# Patient Record
Sex: Female | Born: 1955 | Race: Black or African American | Hispanic: No | Marital: Married | Smoking: Never smoker
Health system: Southern US, Community
[De-identification: ages and names within clinical notes are randomized; demographics above are authoritative.]

## PROBLEM LIST (undated history)

## (undated) DIAGNOSIS — M1990 Unspecified osteoarthritis, unspecified site: Secondary | ICD-10-CM

## (undated) DIAGNOSIS — D649 Anemia, unspecified: Secondary | ICD-10-CM

## (undated) DIAGNOSIS — E785 Hyperlipidemia, unspecified: Secondary | ICD-10-CM

## (undated) DIAGNOSIS — I10 Essential (primary) hypertension: Secondary | ICD-10-CM

## (undated) HISTORY — PX: HEMORRHOID SURGERY: SHX153

## (undated) HISTORY — DX: Anemia, unspecified: D64.9

## (undated) HISTORY — DX: Hyperlipidemia, unspecified: E78.5

## (undated) HISTORY — DX: Essential (primary) hypertension: I10

## (undated) HISTORY — PX: CHEST SURGERY: SHX595

## (undated) HISTORY — PX: BACK SURGERY: SHX140

## (undated) HISTORY — DX: Unspecified osteoarthritis, unspecified site: M19.90

---

## 2005-05-13 ENCOUNTER — Other Ambulatory Visit: Payer: Self-pay

## 2005-05-13 ENCOUNTER — Ambulatory Visit: Payer: Self-pay | Admitting: General Surgery

## 2005-05-19 ENCOUNTER — Ambulatory Visit: Payer: Self-pay | Admitting: General Surgery

## 2005-11-12 ENCOUNTER — Ambulatory Visit: Payer: Self-pay | Admitting: Obstetrics and Gynecology

## 2005-11-20 ENCOUNTER — Ambulatory Visit: Payer: Self-pay | Admitting: Obstetrics and Gynecology

## 2006-01-14 ENCOUNTER — Ambulatory Visit: Payer: Self-pay | Admitting: Internal Medicine

## 2006-05-11 ENCOUNTER — Emergency Department: Payer: Self-pay | Admitting: Emergency Medicine

## 2011-06-15 ENCOUNTER — Ambulatory Visit: Payer: Self-pay | Admitting: Internal Medicine

## 2013-02-08 ENCOUNTER — Encounter: Payer: Self-pay | Admitting: *Deleted

## 2013-03-08 ENCOUNTER — Ambulatory Visit (INDEPENDENT_AMBULATORY_CARE_PROVIDER_SITE_OTHER): Payer: Medicare Other | Admitting: General Surgery

## 2013-03-08 ENCOUNTER — Encounter: Payer: Self-pay | Admitting: General Surgery

## 2013-03-08 VITALS — BP 130/74 | HR 76 | Resp 12 | Ht 62.0 in | Wt 206.0 lb

## 2013-03-08 DIAGNOSIS — Z1211 Encounter for screening for malignant neoplasm of colon: Secondary | ICD-10-CM

## 2013-03-08 MED ORDER — POLYETHYLENE GLYCOL 3350 17 GM/SCOOP PO POWD
ORAL | Status: DC
Start: 2013-03-08 — End: 2022-08-25

## 2013-03-08 NOTE — Progress Notes (Signed)
Patient ID: Rhonda PileCecelia Desjardin, female   DOB: October 30, 1955, 58 y.o.   MRN: [id number]  Chief Complaint  Patient presents with  . Colonoscopy    HPI Rhonda Mclean is a 58 y.o. female here today for an screening colonoscopy exam. Patient states she has never had one before. No GI problems. Blood work done  02/03/13. HPI  Past Medical History  Diagnosis Date  . Hypertension   . Anemia   . Hyperlipidemia     Past Surgical History  Procedure Laterality Date  . Chest surgery      cast  . Back surgery      cast  . Hemorrhoid surgery      History reviewed. No pertinent family history.  Social History History  Substance Use Topics  . Smoking status: Never Smoker   . Smokeless tobacco: Never Used  . Alcohol Use: No    Allergies  Allergen Reactions  . Penicillins     Current Outpatient Prescriptions  Medication Sig Dispense Refill  . amLODipine (NORVASC) 5 MG tablet Take 5 mg by mouth daily.      . ARIPiprazole (ABILIFY) 10 MG tablet Take 10 mg by mouth daily.      Marland Kitchen. lisinopril (PRINIVIL,ZESTRIL) 20 MG tablet Take 20 mg by mouth daily.      . meloxicam (MOBIC) 7.5 MG tablet Take 7.5 mg by mouth daily.      Marland Kitchen. OLANZapine (ZYPREXA) 15 MG tablet Take 15 mg by mouth at bedtime.      . polyethylene glycol powder (GLYCOLAX/MIRALAX) powder 255 grams one bottle for colonoscopy prep  255 g  0   No current facility-administered medications for this visit.    Review of Systems Review of Systems  Constitutional: Negative.   Respiratory: Negative.   Cardiovascular: Negative.     Blood pressure 130/74, pulse 76, resp. rate 12, height 5\' 2"  (1.575 m), weight 206 lb (93.441 kg).  Physical Exam Physical Exam  Constitutional: She is oriented to person, place, and time. She appears well-developed and well-nourished.  Eyes: Conjunctivae are normal.  Neck: Neck supple.  Cardiovascular: Normal rate, regular rhythm and normal heart sounds.   Pulmonary/Chest: Breath sounds normal.   Abdominal: Soft. Bowel sounds are normal. There is no hepatosplenomegaly. There is no tenderness. No hernia.  Neurological: She is alert and oriented to person, place, and time.  Skin: Skin is warm and dry.    Data Reviewed PCP notes and labs  Assessment    Screening for malignant neoplasm colon, average risk     Plan    Discussed colonoscopy, procedure, risks and benefits. Pt is agreeable.     Patient has been scheduled for [facility].    SANKAR,SEEPLAPUTHUR G 03/08/2013, 1:41 PM

## 2013-03-08 NOTE — Patient Instructions (Signed)
Patient has been scheduled for a colonoscopy on 03-15-13 at [facility].

## 2013-03-15 ENCOUNTER — Ambulatory Visit: Payer: Self-pay | Admitting: General Surgery

## 2013-03-15 DIAGNOSIS — K5730 Diverticulosis of large intestine without perforation or abscess without bleeding: Secondary | ICD-10-CM

## 2013-03-15 DIAGNOSIS — Z1211 Encounter for screening for malignant neoplasm of colon: Secondary | ICD-10-CM

## 2013-03-23 ENCOUNTER — Encounter: Payer: Self-pay | Admitting: General Surgery

## 2013-12-04 ENCOUNTER — Encounter: Payer: Self-pay | Admitting: General Surgery

## 2018-04-19 ENCOUNTER — Ambulatory Visit: Payer: Medicare Other | Admitting: General Surgery

## 2018-04-22 ENCOUNTER — Encounter: Payer: Self-pay | Admitting: *Deleted

## 2018-07-21 ENCOUNTER — Encounter: Payer: Self-pay | Admitting: *Deleted

## 2020-07-04 ENCOUNTER — Emergency Department
Admission: EM | Admit: 2020-07-04 | Discharge: 2020-07-04 | Disposition: A | Discharge status: Home / Self Care | Payer: Medicare Other | Attending: Emergency Medicine | Admitting: Emergency Medicine

## 2020-07-04 ENCOUNTER — Emergency Department: Payer: Medicare Other

## 2020-07-04 ENCOUNTER — Other Ambulatory Visit: Payer: Self-pay

## 2020-07-04 ENCOUNTER — Encounter: Payer: Self-pay | Admitting: Emergency Medicine

## 2020-07-04 DIAGNOSIS — M25461 Effusion, right knee: Secondary | ICD-10-CM | POA: Diagnosis not present

## 2020-07-04 DIAGNOSIS — Z79899 Other long term (current) drug therapy: Secondary | ICD-10-CM | POA: Diagnosis not present

## 2020-07-04 DIAGNOSIS — M25569 Pain in unspecified knee: Secondary | ICD-10-CM

## 2020-07-04 DIAGNOSIS — M25561 Pain in right knee: Secondary | ICD-10-CM | POA: Diagnosis present

## 2020-07-04 DIAGNOSIS — I10 Essential (primary) hypertension: Secondary | ICD-10-CM | POA: Insufficient documentation

## 2020-07-04 MED ORDER — PREDNISONE 10 MG PO TABS: 10.0000 mg | ORAL_TABLET | Freq: Every day | ORAL | 0 refills | Status: DC

## 2020-07-04 MED ORDER — HYDROCODONE-ACETAMINOPHEN 5-325 MG PO TABS: 1.0000 | ORAL_TABLET | ORAL | 0 refills | Status: AC | PRN

## 2020-07-04 MED ORDER — PREDNISONE 20 MG PO TABS
60.0000 mg | ORAL_TABLET | Freq: Once | ORAL | Status: AC
  Administered 2020-07-04: 60 mg via ORAL
  Filled 2020-07-04: qty 3

## 2020-07-04 MED ORDER — HYDROCODONE-ACETAMINOPHEN 5-325 MG PO TABS
1.0000 | ORAL_TABLET | Freq: Once | ORAL | Status: AC
  Administered 2020-07-04: 1 via ORAL
  Filled 2020-07-04: qty 1

## 2020-07-04 NOTE — ED Triage Notes (Signed)
Pt to ED via [facility] with c/o R knee swelling, per EMS pt started having pain, around 8pm noted swelling, per EMS pt has been unable to get out of bed. Per EMS pt with no known injury at this time.

## 2020-07-04 NOTE — ED Triage Notes (Signed)
Pt comes into the ED via [facility] c/o right knee pain and swelling.  Pt states she has been unable to get out of her bed since the pain started.  Pt denies any known injuries at this time.

## 2020-07-04 NOTE — ED Provider Notes (Signed)
[facility] Emergency Department Provider Note  ____________________________________________  Time seen: Approximately 3:50 PM  I have reviewed the triage vital signs and the nursing notes.   HISTORY  Chief Complaint Knee Pain    HPI Rhonda Mclean is a 65 y.o. female who presents the emergency department via EMS for right knee pain.  Patient states that she started to have pain in her right knee last night.  No reported trauma.  Patient denies any hip pain, back pain.  Pain is located along the lateral joint line.  No other injury or complaint.  No medications prior to arrival.   No recent illnesses.  Patient denies any history of gout.        Past Medical History:  Diagnosis Date  . Anemia   . Hyperlipidemia   . Hypertension     There are no problems to display for this patient.   Past Surgical History:  Procedure Laterality Date  . BACK SURGERY     cast  . CHEST SURGERY     cast  . HEMORRHOID SURGERY      Prior to Admission medications   Medication Sig Start Date End Date Taking? Authorizing Provider  HYDROcodone-acetaminophen (NORCO/VICODIN) 5-325 MG tablet Take 1 tablet by mouth every 4 (four) hours as needed for moderate pain. 07/04/20 07/04/21 Yes Colbe Viviano, Delorise Royals, PA-C  predniSONE (DELTASONE) 10 MG tablet Take 1 tablet (10 mg total) by mouth daily. 07/04/20  Yes Ludene Stokke, Delorise Royals, PA-C  amLODipine (NORVASC) 5 MG tablet Take 5 mg by mouth daily.    [provider]  ARIPiprazole (ABILIFY) 10 MG tablet Take 10 mg by mouth daily.    [provider]  lisinopril (PRINIVIL,ZESTRIL) 20 MG tablet Take 20 mg by mouth daily.    [provider]  meloxicam (MOBIC) 7.5 MG tablet Take 7.5 mg by mouth daily.    [provider]  OLANZapine (ZYPREXA) 15 MG tablet Take 15 mg by mouth at bedtime.    [provider]  polyethylene glycol powder (GLYCOLAX/MIRALAX) powder 255 grams one bottle for colonoscopy  prep 03/08/13   Kieth Brightly, MD    Allergies Penicillins  History reviewed. No pertinent family history.  Social History Social History   Tobacco Use  . Smoking status: Never Smoker  . Smokeless tobacco: Never Used  Substance Use Topics  . Alcohol use: No  . Drug use: No     Review of Systems  Constitutional: No fever/chills Eyes: No visual changes. No discharge ENT: No upper respiratory complaints. Cardiovascular: no chest pain. Respiratory: no cough. No SOB. Gastrointestinal: No abdominal pain.  No nausea, no vomiting.  No diarrhea.  No constipation. Musculoskeletal: Negative for musculoskeletal pain. Skin: Negative for rash, abrasions, lacerations, ecchymosis. Neurological: Negative for headaches, focal weakness or numbness.  10 System ROS otherwise negative.  ____________________________________________   PHYSICAL EXAM:  VITAL SIGNS: ED Triage Vitals [07/04/20 1336]  Enc Vitals Group     BP (!) 162/98     Pulse Rate 74     Resp 17     Temp 98.5 F (36.9 C)     Temp Source Oral     SpO2 97 %     Weight 205 lb 14.6 oz (93.4 kg)     Height 5\' 2"  (1.575 m)     Head Circumference      Peak Flow      Pain Score 8     Pain Loc  Pain Edu?      Excl. in GC?      Constitutional: Alert and oriented. Well appearing and in no acute distress. Eyes: Conjunctivae are normal. PERRL. EOMI. Head: Atraumatic. ENT:      Ears:       Nose: No congestion/rhinnorhea.      Mouth/Throat: Mucous membranes are moist.  Neck: No stridor.    Cardiovascular: Normal rate, regular rhythm. Normal S1 and S2.  Good peripheral circulation. Respiratory: Normal respiratory effort without tachypnea or retractions. Lungs CTAB. Good air entry to the bases with no decreased or absent breath sounds. Musculoskeletal: Full range of motion to all extremities. No gross deformities appreciated.  Visualization of the right knee reveals minimal edema when compared with left.  There  is no overlying erythema.  Palpation reveals tenderness along the lateral joint line but no other tenderness.  Full passive range of motion though patient has limited active range of motion due to pain.  No ballottement.  No deficits along the quadriceps or patellar ligament/tendon.  Special tests are negative.  No warmth to palpation.  Dorsalis pedis pulses sensation intact distally. Neurologic:  Normal speech and language. No gross focal neurologic deficits are appreciated.  Skin:  Skin is warm, dry and intact. No rash noted. Psychiatric: Mood and affect are normal. Speech and behavior are normal. Patient exhibits appropriate insight and judgement.   ____________________________________________   LABS (all labs ordered are listed, but only abnormal results are displayed)  Labs Reviewed - No data to display ____________________________________________  EKG   ____________________________________________  RADIOLOGY I personally viewed and evaluated these images as part of my medical decision making, as well as reviewing the written report by the radiologist.  ED Provider Interpretation: Degenerative changes advanced from previous x-ray 15 years ago  DG Knee Complete 4 Views Right  Result Date: 07/04/2020 CLINICAL DATA:  RIGHT knee pain and swelling EXAM: RIGHT KNEE - COMPLETE 4+ VIEW COMPARISON:  01/14/2006 FINDINGS: Osseous demineralization. Mild joint space narrowing with scattered marginal spurs. Large joint effusion. No acute fracture, dislocation, or bone destruction. Mild chondrocalcinosis question CPPD. 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 Large RIGHT knee joint effusion. Electronically Signed   By: Ulyses Southward M.D.   On: 07/04/2020 14:16    ____________________________________________    PROCEDURES  Procedure(s) performed:    Procedures    Medications  predniSONE (DELTASONE) tablet 60 mg (60 mg Oral Given 07/04/20 1624)   HYDROcodone-acetaminophen (NORCO/VICODIN) 5-325 MG per tablet 1 tablet (1 tablet Oral Given 07/04/20 1624)     ____________________________________________   INITIAL IMPRESSION / ASSESSMENT AND PLAN / ED COURSE  Pertinent labs & imaging results that were available during my care of the patient were reviewed by me and considered in my medical decision making (see chart for details).  Review of the [place] CSRS was performed in accordance of the [facility] prior to dispensing any controlled drugs.           Patient's diagnosis is consistent with knee pain, knee effusion.  Patient presented to the emergency department complaining of knee pain that began yesterday.  This was reported as nontraumatic in nature.  Visual exam revealed mild amount of edema, no erythema.  There was no significant warmth to the joint.  Patient was tender along the lateral joint line.  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  Differential included arthritis, knee strain, gout, septic joint.  Given the physical exam I feel  that gout and septic joint is largely unlikely.  Given no reported trauma a occult fracture is also unlikely.  Suspect that symptoms are largely secondary to arthritic changes.  Patient will be placed on anti-inflammatories, given walker for ambulation.  Patient should follow-up with orthopedics as needed..  I discussed the findings and recommendations with the patient is given ED precautions to return to the ED for any worsening or new symptoms.     ____________________________________________  FINAL CLINICAL IMPRESSION(S) / ED DIAGNOSES  Final diagnoses:  Knee pain  Acute pain of right knee  Effusion of right knee      NEW MEDICATIONS STARTED DURING THIS VISIT:  ED Discharge Orders         Ordered    predniSONE (DELTASONE) 10 MG tablet  Daily       Note to Pharmacy: Take 6 pills x 2 days, 5 pills x 2 days, 4 pills x 2 days, 3 pills x 2 days, 2  pills x 2 days, and 1 pill x 2 days   07/04/20 1628    HYDROcodone-acetaminophen (NORCO/VICODIN) 5-325 MG tablet  Every 4 hours PRN        07/04/20 1628              This chart was dictated using voice recognition software/Dragon. Despite best efforts to proofread, errors can occur which can change the meaning. Any change was purely unintentional.    Racheal Patches, PA-C 07/04/20 1629    Delton Prairie, MD 07/04/20 [phone]

## 2021-09-18 ENCOUNTER — Other Ambulatory Visit: Payer: Self-pay | Admitting: Internal Medicine

## 2021-09-18 DIAGNOSIS — Z1231 Encounter for screening mammogram for malignant neoplasm of breast: Secondary | ICD-10-CM

## 2021-10-14 ENCOUNTER — Ambulatory Visit
Admission: RE | Admit: 2021-10-14 | Discharge: 2021-10-14 | Disposition: A | Discharge status: Home / Self Care | Payer: Medicare Other | Source: Ambulatory Visit | Attending: Internal Medicine | Admitting: Internal Medicine

## 2021-10-14 DIAGNOSIS — Z1231 Encounter for screening mammogram for malignant neoplasm of breast: Secondary | ICD-10-CM | POA: Insufficient documentation

## 2021-10-15 ENCOUNTER — Inpatient Hospital Stay
Admission: RE | Admit: 2021-10-15 | Discharge: 2021-10-15 | Disposition: A | Discharge status: Home / Self Care | Payer: Self-pay | Source: Ambulatory Visit | Attending: *Deleted | Admitting: *Deleted

## 2021-10-15 ENCOUNTER — Other Ambulatory Visit: Payer: Self-pay | Admitting: *Deleted

## 2021-10-15 DIAGNOSIS — Z1231 Encounter for screening mammogram for malignant neoplasm of breast: Secondary | ICD-10-CM

## 2021-10-24 IMAGING — CR DG KNEE COMPLETE 4+V*R*
4 series · 4 of 4 positions shown · non-contrast
Comparison: 01/14/2006

CLINICAL DATA: RIGHT knee pain and swelling

EXAM:
RIGHT KNEE - COMPLETE 4+ VIEW

[knee ap]
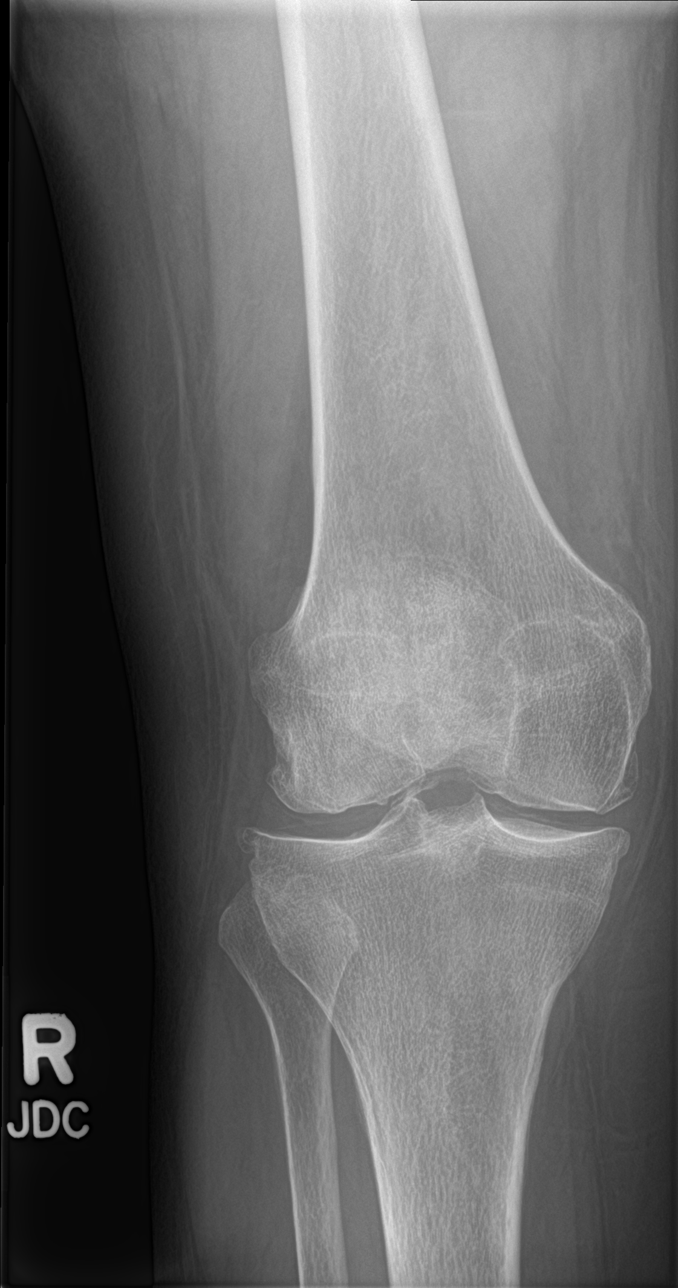

[knee obl (1 of 2)]
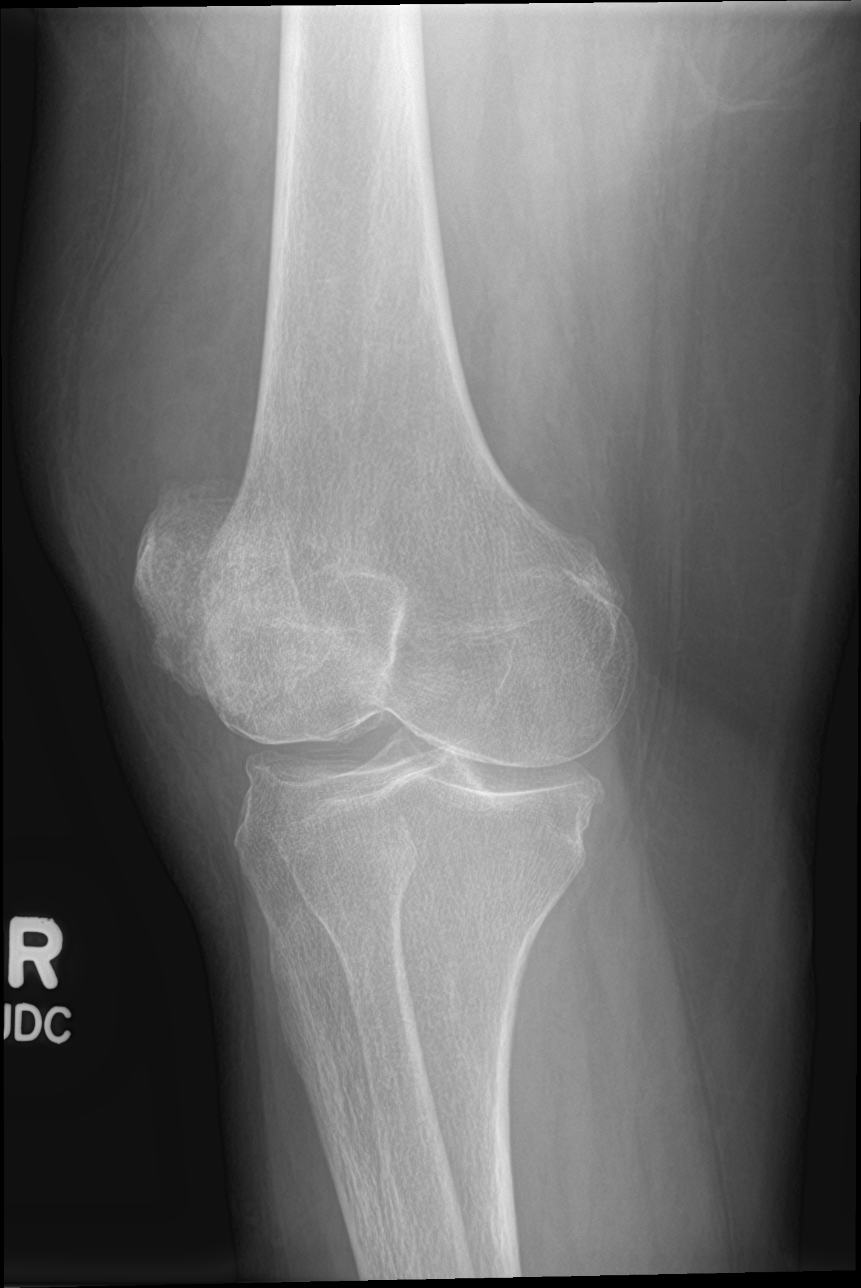

[knee obl (2 of 2)]
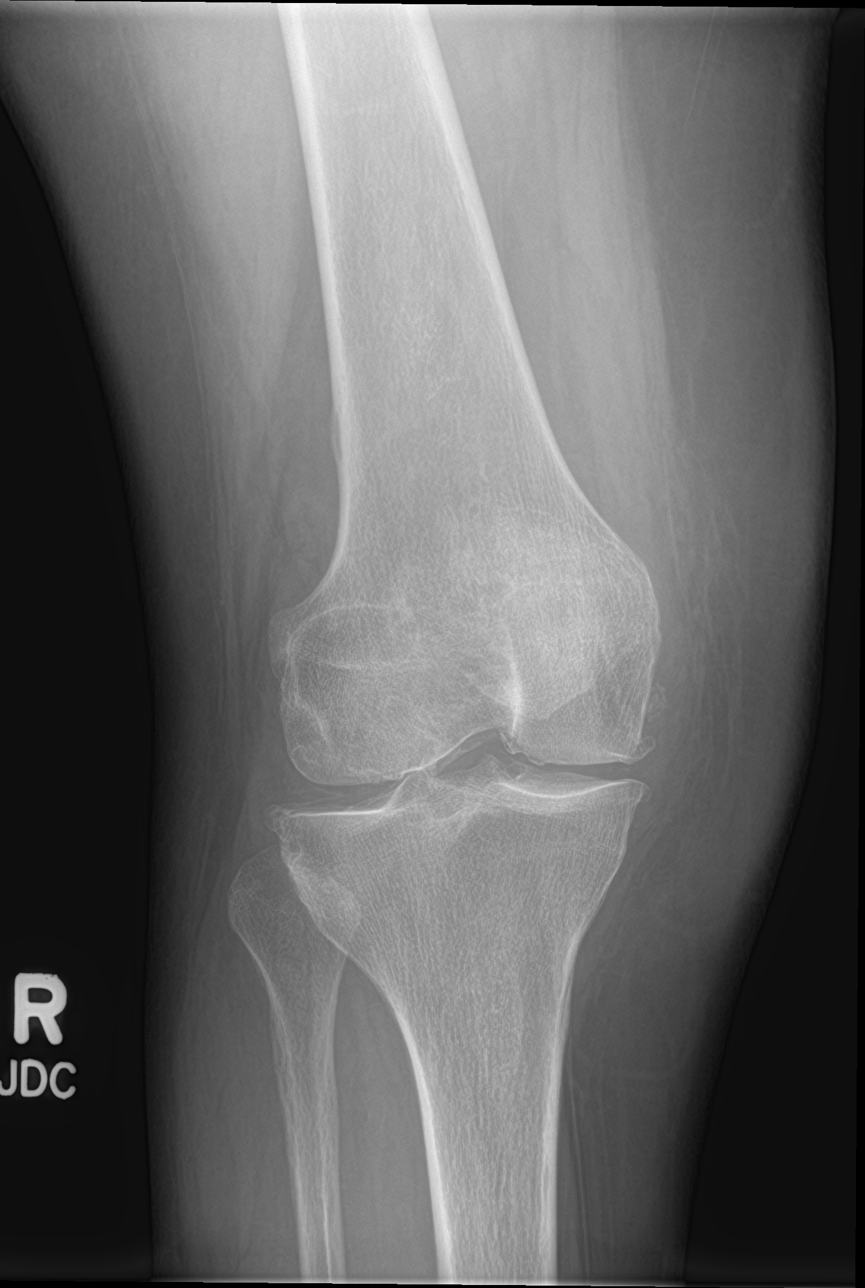

[knee lat]
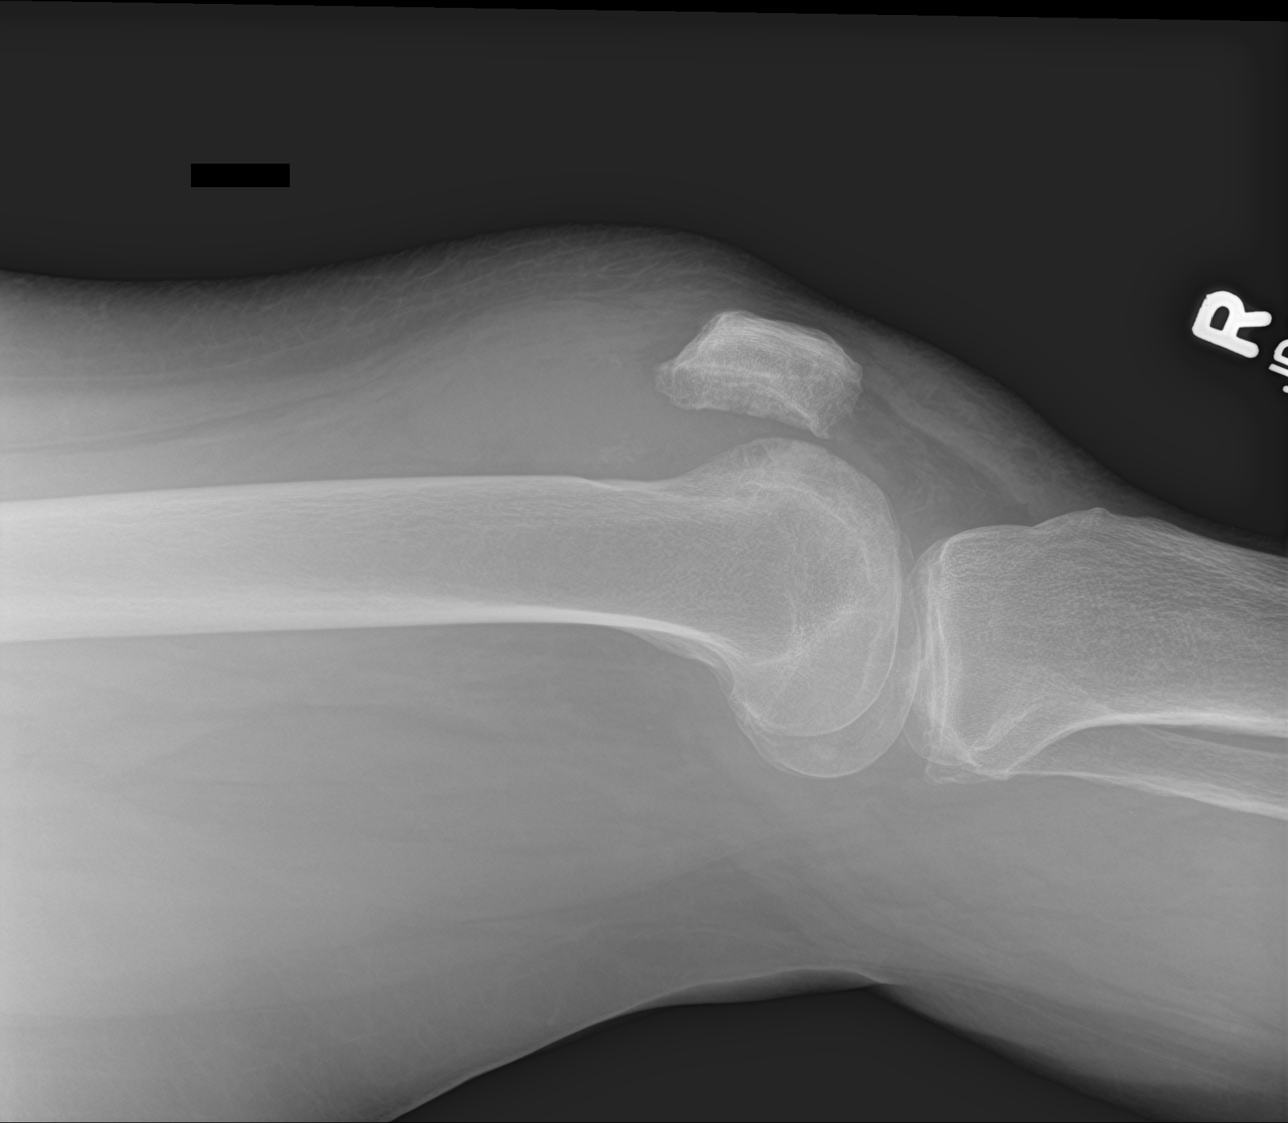

[4 of 4 positions shown; findings below may reference images not displayed]

FINDINGS: Osseous demineralization.

Mild joint space narrowing with scattered marginal spurs.

Large joint effusion.

No acute fracture, dislocation, or bone destruction.

Mild chondrocalcinosis question CPPD.
IMPRESSION: Degenerative changes and suspect CPPD RIGHT knee, progressive since

Large RIGHT knee joint effusion.

## 2021-11-24 ENCOUNTER — Other Ambulatory Visit: Payer: Self-pay | Admitting: Internal Medicine

## 2022-08-05 DIAGNOSIS — H524 Presbyopia: Secondary | ICD-10-CM | POA: Diagnosis not present

## 2022-08-05 DIAGNOSIS — Z0100 Encounter for examination of eyes and vision without abnormal findings: Secondary | ICD-10-CM | POA: Diagnosis not present

## 2022-08-25 ENCOUNTER — Ambulatory Visit (INDEPENDENT_AMBULATORY_CARE_PROVIDER_SITE_OTHER): Payer: Medicare HMO | Admitting: Internal Medicine

## 2022-08-25 ENCOUNTER — Encounter: Payer: Self-pay | Admitting: Internal Medicine

## 2022-08-25 VITALS — BP 132/84 | HR 68 | Ht 62.0 in | Wt 184.0 lb

## 2022-08-25 DIAGNOSIS — E6609 Other obesity due to excess calories: Secondary | ICD-10-CM | POA: Diagnosis not present

## 2022-08-25 DIAGNOSIS — E66811 Obesity, class 1: Secondary | ICD-10-CM | POA: Insufficient documentation

## 2022-08-25 DIAGNOSIS — F200 Paranoid schizophrenia: Secondary | ICD-10-CM | POA: Diagnosis not present

## 2022-08-25 DIAGNOSIS — R7309 Other abnormal glucose: Secondary | ICD-10-CM | POA: Diagnosis not present

## 2022-08-25 DIAGNOSIS — Z5181 Encounter for therapeutic drug level monitoring: Secondary | ICD-10-CM | POA: Diagnosis not present

## 2022-08-25 DIAGNOSIS — I10 Essential (primary) hypertension: Secondary | ICD-10-CM

## 2022-08-25 DIAGNOSIS — Z6833 Body mass index (BMI) 33.0-33.9, adult: Secondary | ICD-10-CM

## 2022-08-25 NOTE — Assessment & Plan Note (Signed)
Continue olanzapine, omeprazole and hydroxyzine per psychiatry Extensive lab workup and EKG requested by psychiatrist Support offered

## 2022-08-25 NOTE — Patient Instructions (Signed)
Cooking With Less Salt Cooking with less salt is one way to reduce the amount of salt (sodium) you get from food. Most people should have less than 2,300 milligrams (mg) of sodium each day. If you have high blood pressure (hypertension), you may need to limit your sodium to 1,500 mg each day. Follow the tips below to help reduce your sodium intake. What are tips for eating less sodium? Reading food labels  Check the food label before buying or using packaged ingredients. Always check the label for the serving size and sodium content. Choose products with less than 140 mg of sodium per serving. Check the % Daily Value column to see what percent of the daily recommended amount of sodium is in one serving of the product. Foods with 5% or less are low in sodium. Foods with 20% or more are high in sodium. Do not choose foods that have salt as one of the first three ingredients on the ingredients list. Always check how much sodium is in a product, even if the label says "unsalted" or "no salt added." Shopping Buy sodium-free or low-sodium products. Look for these words: Low-sodium. Sodium-free. Reduced-sodium. No salt added. Unsalted. Buy fresh or frozen foods without sauces or additives. Cooking Instead of salt, use herbs, seasonings without salt, and spices. Use sodium-free baking soda. Grill, braise, or roast foods to add flavor with less salt. Do not add salt to pasta, rice, or hot cereals. Drain and rinse canned vegetables, beans, and meat before use. Do not add salt when cooking sweets and desserts. Cook with low-sodium ingredients. Meal planning The sodium in bread can add up. Try to plan meals with other grains. These may include whole oats, quinoa, whole wheat pasta, and other whole grains that do not have sodium added to them. What foods are high in sodium? Vegetables Regular canned vegetables, except low-sodium or reduced-sodium items. Sauerkraut, pickled vegetables, and relishes.  Olives. French fries. Onion rings. Regular canned tomato sauce and paste. Regular tomato and vegetable juice. Frozen vegetables in sauces. Grains Instant hot cereals. Bread stuffing, pancake, and biscuit mixes. Croutons. Seasoned rice or pasta mixes. Noodle soup cups. Boxed or frozen macaroni and cheese. Regular salted crackers. Self-rising flour. Rolls. Bagels. Flour tortillas and wraps. Meats and other proteins Meat or fish that is salted, canned, smoked, cured, spiced, or pickled. Precooked or cured meat, such as sausages or meat loaves. Bacon. Ham. Pepperoni. Hot dogs. Corned beef. Chipped beef. Salt pork. Jerky. Pickled herring, anchovies, and sardines. Regular canned tuna. Salted nuts. Dairy Processed cheese and cheese spreads. Hard cheeses. Cheese curds. Blue cheese. Feta cheese. String cheese. Regular cottage cheese. Buttermilk. Canned milk. The items listed above may not be a full list of foods high in sodium. Talk to a dietitian to learn more. What foods are low in sodium? Fruits Fresh, frozen, or canned fruit with no sauce added. Fruit juice. Vegetables Fresh or frozen vegetables with no sauce added. "No salt added" canned vegetables. "No salt added" tomato sauce and paste. Low-sodium or reduced-sodium tomato and vegetable juice. Grains Noodles, pasta, quinoa, rice. Shredded or puffed wheat or puffed rice. Regular or quick oats (not instant). Low-sodium crackers. Low-sodium bread. Whole grain bread and whole grain pasta. Unsalted popcorn. Meats and other proteins Fresh or frozen whole meats, poultry that has not been injected with sodium, and fish with no sauce added. Unsalted nuts. Dried peas, beans, and lentils without added salt. Unsalted canned beans. Eggs. Unsalted nut butters. Low-sodium canned tuna or chicken. Dairy   Milk. Soy milk. Yogurt. Low-sodium cheeses, such as Swiss, Monterey Jack, mozzarella, and ricotta. Sherbet or ice cream (keep to  cup per serving). Cream  cheese. Fats and oils Unsalted butter or margarine. Other foods Homemade pudding. Sodium-free baking soda and baking powder. Herbs and spices. Low-sodium seasoning mixes. Beverages Coffee and tea. Carbonated beverages. The items listed above may not be a full list of foods low in sodium. Talk to a dietitian to learn more. What are some salt alternatives when cooking? Herbs, seasonings, and spices can be used instead of salt to flavor your food. Herbs should be fresh or dried. Do not choose packaged mixes. Next to the name of the herb, spice, or seasoning below are some foods you can pair it with. Herbs Bay leaves - Soups, meat and vegetable dishes, and spaghetti sauce. Basil - Italian dishes, soups, pasta, and fish dishes. Cilantro - Meat, poultry, and vegetable dishes. Chili powder - Marinades and Mexican dishes. Chives - Salad dressings and potato dishes. Cumin - Mexican dishes, couscous, and meat dishes. Dill - Fish dishes, sauces, and salads. Fennel - Meat and vegetable dishes, breads, and cookies. Garlic (do not use garlic salt) - Italian dishes, meat dishes, salad dressings, and sauces. Marjoram - Soups, potato dishes, and meat dishes. Oregano - Pizza and spaghetti sauce. Parsley - Salads, soups, pasta, and meat dishes. Rosemary - Italian dishes, salad dressings, soups, and red meats. Saffron - Fish dishes, pasta, and some poultry dishes. Sage - Stuffings and sauces. Tarragon - Fish and poultry dishes. Thyme - Stuffing, meat, and fish dishes. Seasonings Lemon juice - Fish dishes, poultry dishes, vegetables, and salads. Vinegar - Salad dressings, vegetables, and fish dishes. Spices Cinnamon - Sweet dishes, such as cakes, cookies, and puddings. Cloves - Gingerbread, puddings, and marinades for meats. Curry - Vegetable dishes, fish and poultry dishes, and stir-fry dishes. Ginger - Vegetable dishes, fish dishes, and stir-fry dishes. Nutmeg - Pasta, vegetables, poultry, fish  dishes, and custard. This information is not intended to replace advice given to you by your health care provider. Make sure you discuss any questions you have with your health care provider. Document Revised: 02/12/2022 Document Reviewed: 02/05/2022 Elsevier Patient Education  2024 Elsevier Inc.  

## 2022-08-25 NOTE — Assessment & Plan Note (Signed)
Encourage diet and exercise for weight loss 

## 2022-08-25 NOTE — Assessment & Plan Note (Signed)
Controlled on lisinopril Reinforced DASH diet and exercise for weight loss C-Met today 

## 2022-08-25 NOTE — Progress Notes (Signed)
HPI  Patient presents to clinic today to establish care and for management of the conditions listed below.  HTN: Her BP today is 132/84. She is taking lisinopril  as prescribed.  ECG from 05/2005 reviewed.  Schizophrenia: Managed on olanzapine, aripiprazole and hydroxyzine. She does have audio but not visual hallucinations. She is not current seeing a therapist but is seeing a psychiatrist. She denies anxiety, depression, SI/HI.  Past Medical History:  Diagnosis Date   Anemia    Arthritis    Hyperlipidemia    Hypertension     Current Outpatient Medications  Medication Sig Dispense Refill   ARIPiprazole (ABILIFY IM) Inject into the muscle.     hydrOXYzine (VISTARIL) 25 MG capsule Take 25 mg by mouth daily as needed.     lisinopril (PRINIVIL,ZESTRIL) 20 MG tablet Take 20 mg by mouth daily.     OLANZapine (ZYPREXA) 15 MG tablet Take 15 mg by mouth at bedtime.     No current facility-administered medications for this visit.    Allergies  Allergen Reactions   Flavoring Agent Other (See Comments)    Blackberry: pt. States she gets fever   Cheese Itching    Cream cheese   Penicillins     No family history on file.  Social History   Socioeconomic History   Marital status: Married    Spouse name: Not on file   Number of children: Not on file   Years of education: Not on file   Highest education level: Not on file  Occupational History   Not on file  Tobacco Use   Smoking status: Never   Smokeless tobacco: Never  Vaping Use   Vaping status: Never Used  Substance and Sexual Activity   Alcohol use: No   Drug use: No   Sexual activity: Not Currently  Other Topics Concern   Not on file  Social History Narrative   Not on file   Social Determinants of Health   Financial Resource Strain: Not on file  Food Insecurity: Not on file  Transportation Needs: Not on file  Physical Activity: Not on file  Stress: Not on file  Social Connections: Not on file  Intimate Partner  Violence: Not on file    ROS:  Constitutional: Denies fever, malaise, fatigue, headache or abrupt weight changes.  HEENT: Denies eye pain, eye redness, ear pain, ringing in the ears, wax buildup, runny nose, nasal congestion, bloody nose, or sore throat. Respiratory: Denies difficulty breathing, shortness of breath, cough or sputum production.   Cardiovascular: Denies chest pain, chest tightness, palpitations or swelling in the hands or feet.  Gastrointestinal: Denies abdominal pain, bloating, constipation, diarrhea or blood in the stool.  GU: Denies frequency, urgency, pain with urination, blood in urine, odor or discharge. Musculoskeletal: Denies decrease in range of motion, difficulty with gait, muscle pain or joint pain and swelling.  Skin: Denies redness, rashes, lesions or ulcercations.  Neurological: Denies dizziness, difficulty with memory, difficulty with speech or problems with balance and coordination.  Psych: Patient reports audio hallucinations.  Denies anxiety, depression, SI/HI.  No other specific complaints in a complete review of systems (except as listed in HPI above).  PE:  BP 132/84   Pulse 68   Ht 5\' 2"  (1.575 m)   Wt 184 lb (83.5 kg)   SpO2 97%   BMI 33.65 kg/m  Wt Readings from Last 3 Encounters:  08/25/22 184 lb (83.5 kg)  07/04/20 205 lb 14.6 oz (93.4 kg)  03/08/13 206 lb (93.4  kg)    General: Appears her stated age, obese, in NAD. HEENT: Head: normal shape and size; Eyes: sclera white, no icterus, conjunctiva pink, PERRLA and EOMs intact;  Cardiovascular: Normal rate and rhythm. S1,S2 noted.  No murmur, rubs or gallops noted. No JVD or BLE edema. No carotid bruits noted. Pulmonary/Chest: Normal effort and positive vesicular breath sounds. No respiratory distress. No wheezes, rales or ronchi noted.  Musculoskeletal: No difficulty with gait.  Neurological: Alert and oriented.  Psychiatric: Mood and affect flat. Behavior is normal. Judgment and thought  content normal.     Assessment and Plan:  Indication for ECG: Medication monitoring Interpretation of ECG: Normal rate and rhythm.  Nonspecific anterior T wave changes. Comparison of ECG: 05/2005, new anterior T wave changes that are nonspecific  RTC in 6 months for your annual exam Nicki Reaper, NP

## 2022-08-26 ENCOUNTER — Encounter: Payer: Self-pay | Admitting: Internal Medicine

## 2022-08-26 DIAGNOSIS — N183 Chronic kidney disease, stage 3 unspecified: Secondary | ICD-10-CM | POA: Insufficient documentation

## 2022-08-26 LAB — CBC WITH DIFFERENTIAL/PLATELET
Absolute Monocytes: 191 cells/uL — ABNORMAL LOW (ref 200–950)
Basophils Relative: 0.2 %
Eosinophils Absolute: 0 cells/uL — ABNORMAL LOW (ref 15–500)
Eosinophils Relative: 0 %
Lymphs Abs: 1201 cells/uL (ref 850–3900)
MPV: 8.7 fL (ref 7.5–12.5)
Monocytes Relative: 3.9 %
Neutro Abs: 3499 cells/uL (ref 1500–7800)
Neutrophils Relative %: 71.4 %
Platelets: 170 10*3/uL (ref 140–400)
RBC: 3.86 10*6/uL (ref 3.80–5.10)
Total Lymphocyte: 24.5 %
WBC: 4.9 10*3/uL (ref 3.8–10.8)

## 2022-08-26 LAB — COMPLETE METABOLIC PANEL WITH GFR
ALT: 11 U/L (ref 6–29)
AST: 17 U/L (ref 10–35)
BUN/Creatinine Ratio: 10 (calc) (ref 6–22)
BUN: 12 mg/dL (ref 7–25)
Creat: 1.19 mg/dL — ABNORMAL HIGH (ref 0.50–1.05)
Glucose, Bld: 88 mg/dL (ref 65–99)
Potassium: 4.3 mmol/L (ref 3.5–5.3)
Sodium: 139 mmol/L (ref 135–146)
Total Bilirubin: 0.7 mg/dL (ref 0.2–1.2)

## 2022-08-26 LAB — LIPID PANEL
Cholesterol: 156 mg/dL (ref <200)
HDL: 56 mg/dL (ref >=50)
Non-HDL Cholesterol (Calc): 100 mg/dL (calc) (ref <130)
Triglycerides: 64 mg/dL (ref <150)

## 2022-08-26 LAB — PROLACTIN: Prolactin: 1.8 ng/mL — ABNORMAL LOW

## 2022-08-26 LAB — TSH: TSH: 1.34 mIU/L (ref 0.40–4.50)

## 2022-08-27 ENCOUNTER — Telehealth: Payer: Self-pay

## 2022-08-27 LAB — COMPLETE METABOLIC PANEL WITH GFR
AG Ratio: 1.5 (calc) (ref 1.0–2.5)
Albumin: 4.1 g/dL (ref 3.6–5.1)
Alkaline phosphatase (APISO): 75 U/L (ref 37–153)
CO2: 27 mmol/L (ref 20–32)
Calcium: 9.6 mg/dL (ref 8.6–10.4)
Chloride: 106 mmol/L (ref 98–110)
Globulin: 2.8 g/dL (calc) (ref 1.9–3.7)
Total Protein: 6.9 g/dL (ref 6.1–8.1)
eGFR: 50 mL/min/{1.73_m2} — ABNORMAL LOW (ref >=60)

## 2022-08-27 LAB — CBC WITH DIFFERENTIAL/PLATELET
Basophils Absolute: 10 cells/uL (ref 0–200)
HCT: 35.5 % (ref 35.0–45.0)
Hemoglobin: 11.5 g/dL — ABNORMAL LOW (ref 11.7–15.5)
MCH: 29.8 pg (ref 27.0–33.0)
MCHC: 32.4 g/dL (ref 32.0–36.0)
MCV: 92 fL (ref 80.0–100.0)
RDW: 13.9 % (ref 11.0–15.0)

## 2022-08-27 LAB — HEMOGLOBIN A1C
Hgb A1c MFr Bld: 4.8 % of total Hgb (ref <5.7)
eAG (mmol/L): 5 mmol/L

## 2022-08-27 LAB — LIPID PANEL
LDL Cholesterol (Calc): 85 mg/dL (calc)
Total CHOL/HDL Ratio: 2.8 (calc) (ref <5.0)

## 2022-08-27 LAB — T4, FREE: Free T4: 1.3 ng/dL (ref 0.8–1.8)

## 2022-08-27 NOTE — Telephone Encounter (Signed)
Spoke with patients daughter and informed her of results since unable to reach patient.

## 2022-08-27 NOTE — Telephone Encounter (Signed)
-----   Message from [facility] sent at 08/26/2022 12:03 PM EDT ----- Your prolactin levels are low.  Your kidney function is decreased.  It is important that you consume 48 to 64 ounces of water daily and avoid anti-inflammatories OTC.  Liver function is normal.  Blood counts essentially normal.  Cholesterol looks great.  Thyroid function is normal.

## 2022-09-14 ENCOUNTER — Other Ambulatory Visit: Payer: Self-pay | Admitting: Internal Medicine

## 2022-09-16 NOTE — Telephone Encounter (Signed)
Requested medication (s) are due for refill today: routing for review  Requested medication (s) are on the active medication list: yes  Last refill:  unknown  Future visit scheduled: yes  Notes to clinic:  Unable to refill per protocol,historical medication routing for approval.     Requested Prescriptions  Pending Prescriptions Disp Refills   lisinopril (ZESTRIL) 10 MG tablet [Pharmacy Med Name: LISINOPRIL 10 MG TAB] 30 tablet     Sig: TAKE 1 TABLET BY MOUTH DAILY     Cardiovascular:  ACE Inhibitors Failed - 09/14/2022  2:56 PM      Failed - Cr in normal range and within 180 days    Creat  Date Value Ref Range Status  08/25/2022 1.19 (H) 0.50 - 1.05 mg/dL Final         Passed - K in normal range and within 180 days    Potassium  Date Value Ref Range Status  08/25/2022 4.3 3.5 - 5.3 mmol/L Final         Passed - Patient is not pregnant      Passed - Last BP in normal range    BP Readings from Last 1 Encounters:  08/25/22 132/84         Passed - Valid encounter within last 6 months    Recent Outpatient Visits           3 weeks ago Primary hypertension   [place] [place], Salvadore Oxford, NP       Future Appointments             In 2 days [place], Salvadore Oxford, NP [facility]   In 5 months [place], Salvadore Oxford, NP  [facility], [facility]

## 2022-09-17 ENCOUNTER — Other Ambulatory Visit: Payer: Self-pay

## 2022-09-17 MED ORDER — LISINOPRIL 20 MG PO TABS: 20.0000 mg | ORAL_TABLET | Freq: Every day | ORAL | 1 refills | Status: DC

## 2022-09-17 NOTE — Telephone Encounter (Signed)
[phone]   Marchelle Folks with [facility] called saying the patient is out of the lisinopril 10 mg and they bubble pack her prescriptions.

## 2022-09-18 ENCOUNTER — Ambulatory Visit: Payer: Medicare HMO | Admitting: Internal Medicine

## 2022-09-18 NOTE — Progress Notes (Deleted)
Subjective:    Patient ID: Rhonda Mclean, female    DOB: Jul 16, 1955, 67 y.o.   MRN: [id number]  HPI  Patient presents to clinic today with complaint of.  Review of Systems     Past Medical History:  Diagnosis Date   Anemia    Arthritis    Hyperlipidemia    Hypertension     Current Outpatient Medications  Medication Sig Dispense Refill   ARIPiprazole (ABILIFY IM) Inject into the muscle.     hydrOXYzine (VISTARIL) 25 MG capsule Take 25 mg by mouth daily as needed.     lisinopril (ZESTRIL) 20 MG tablet Take 1 tablet (20 mg total) by mouth daily. 90 tablet 1   OLANZapine (ZYPREXA) 15 MG tablet Take 15 mg by mouth at bedtime.     No current facility-administered medications for this visit.    Allergies  Allergen Reactions   Flavoring Agent Other (See Comments)    Blackberry: pt. States she gets fever   Cheese Itching    Cream cheese   Penicillins     No family history on file.  Social History   Socioeconomic History   Marital status: Married    Spouse name: Not on file   Number of children: Not on file   Years of education: Not on file   Highest education level: Not on file  Occupational History   Not on file  Tobacco Use   Smoking status: Never   Smokeless tobacco: Never  Vaping Use   Vaping status: Never Used  Substance and Sexual Activity   Alcohol use: No   Drug use: No   Sexual activity: Not Currently  Other Topics Concern   Not on file  Social History Narrative   Not on file   Social Determinants of Health   Financial Resource Strain: Not on file  Food Insecurity: Not on file  Transportation Needs: Not on file  Physical Activity: Not on file  Stress: Not on file  Social Connections: Not on file  Intimate Partner Violence: Not on file     Constitutional: Denies fever, malaise, fatigue, headache or abrupt weight changes.  HEENT: Denies eye pain, eye redness, ear pain, ringing in the ears, wax buildup, runny nose, nasal congestion,  bloody nose, or sore throat. Respiratory: Denies difficulty breathing, shortness of breath, cough or sputum production.   Cardiovascular: Denies chest pain, chest tightness, palpitations or swelling in the hands or feet.  Gastrointestinal: Denies abdominal pain, bloating, constipation, diarrhea or blood in the stool.  GU: Denies urgency, frequency, pain with urination, burning sensation, blood in urine, odor or discharge. Musculoskeletal: Denies decrease in range of motion, difficulty with gait, muscle pain or joint pain and swelling.  Skin: Denies redness, rashes, lesions or ulcercations.  Neurological: Denies dizziness, difficulty with memory, difficulty with speech or problems with balance and coordination.  Psych: Denies anxiety, depression, SI/HI.  No other specific complaints in a complete review of systems (except as listed in HPI above).  Objective:   Physical Exam  There were no vitals taken for this visit. Wt Readings from Last 3 Encounters:  08/25/22 184 lb (83.5 kg)  07/04/20 205 lb 14.6 oz (93.4 kg)  03/08/13 206 lb (93.4 kg)    General: Appears their stated age, well developed, well nourished in NAD. Skin: Warm, dry and intact. No rashes, lesions or ulcerations noted. HEENT: Head: normal shape and size; Eyes: sclera white, no icterus, conjunctiva pink, PERRLA and EOMs intact; Ears: Tm's gray and intact,  normal light reflex; Nose: mucosa pink and moist, septum midline; Throat/Mouth: Teeth present, mucosa pink and moist, no exudate, lesions or ulcerations noted.  Neck:  Neck supple, trachea midline. No masses, lumps or thyromegaly present.  Cardiovascular: Normal rate and rhythm. S1,S2 noted.  No murmur, rubs or gallops noted. No JVD or BLE edema. No carotid bruits noted. Pulmonary/Chest: Normal effort and positive vesicular breath sounds. No respiratory distress. No wheezes, rales or ronchi noted.  Abdomen: Soft and nontender. Normal bowel sounds. No distention or masses  noted. Liver, spleen and kidneys non palpable. Musculoskeletal: Normal range of motion. No signs of joint swelling. No difficulty with gait.  Neurological: Alert and oriented. Cranial nerves II-XII grossly intact. Coordination normal.  Psychiatric: Mood and affect normal. Behavior is normal. Judgment and thought content normal.     BMET    Component Value Date/Time   NA 139 08/25/2022 1036   K 4.3 08/25/2022 1036   CL 106 08/25/2022 1036   CO2 27 08/25/2022 1036   GLUCOSE 88 08/25/2022 1036   BUN 12 08/25/2022 1036   CREATININE 1.19 (H) 08/25/2022 1036   CALCIUM 9.6 08/25/2022 1036    Lipid Panel     Component Value Date/Time   CHOL 156 08/25/2022 1036   TRIG 64 08/25/2022 1036   HDL 56 08/25/2022 1036   CHOLHDL 2.8 08/25/2022 1036   LDLCALC 85 08/25/2022 1036    CBC    Component Value Date/Time   WBC 4.9 08/25/2022 1036   RBC 3.86 08/25/2022 1036   HGB 11.5 (L) 08/25/2022 1036   HCT 35.5 08/25/2022 1036   PLT 170 08/25/2022 1036   MCV 92.0 08/25/2022 1036   MCH 29.8 08/25/2022 1036   MCHC 32.4 08/25/2022 1036   RDW 13.9 08/25/2022 1036   LYMPHSABS 1,201 08/25/2022 1036   EOSABS 0 (L) 08/25/2022 1036   BASOSABS 10 08/25/2022 1036    Hgb A1C Lab Results  Component Value Date   HGBA1C 4.8 08/25/2022            Assessment & Plan:      RTC in 5 months for your annual exam Nicki Reaper, NP

## 2022-12-02 ENCOUNTER — Ambulatory Visit: Payer: Medicare Other | Admitting: Nurse Practitioner

## 2022-12-17 ENCOUNTER — Ambulatory Visit: Payer: Medicare Other | Admitting: Nurse Practitioner

## 2023-02-23 ENCOUNTER — Ambulatory Visit (INDEPENDENT_AMBULATORY_CARE_PROVIDER_SITE_OTHER): Payer: Medicare HMO | Admitting: Internal Medicine

## 2023-02-23 ENCOUNTER — Encounter: Payer: Self-pay | Admitting: Internal Medicine

## 2023-02-23 VITALS — BP 130/78 | Ht 62.5 in | Wt 178.8 lb

## 2023-02-23 DIAGNOSIS — Z0001 Encounter for general adult medical examination with abnormal findings: Secondary | ICD-10-CM

## 2023-02-23 DIAGNOSIS — E66811 Obesity, class 1: Secondary | ICD-10-CM

## 2023-02-23 DIAGNOSIS — Z6832 Body mass index (BMI) 32.0-32.9, adult: Secondary | ICD-10-CM | POA: Diagnosis not present

## 2023-02-23 DIAGNOSIS — R739 Hyperglycemia, unspecified: Secondary | ICD-10-CM

## 2023-02-23 DIAGNOSIS — Z136 Encounter for screening for cardiovascular disorders: Secondary | ICD-10-CM

## 2023-02-23 DIAGNOSIS — E6609 Other obesity due to excess calories: Secondary | ICD-10-CM

## 2023-02-23 MED ORDER — LISINOPRIL 20 MG PO TABS
20.0000 mg | ORAL_TABLET | Freq: Every day | ORAL | 1 refills | Status: DC
Start: 2023-02-23 — End: 2023-08-25

## 2023-02-23 NOTE — Patient Instructions (Signed)
Health Maintenance for Postmenopausal Women Menopause is a normal process in which your ability to get pregnant comes to an end. This process happens slowly over many months or years, usually between the ages of 48 and 55. Menopause is complete when you have missed your menstrual period for 12 months. It is important to talk with your health care provider about some of the most common conditions that affect women after menopause (postmenopausal women). These include heart disease, cancer, and bone loss (osteoporosis). Adopting a healthy lifestyle and getting preventive care can help to promote your health and wellness. The actions you take can also lower your chances of developing some of these common conditions. What are the signs and symptoms of menopause? During menopause, you may have the following symptoms: Hot flashes. These can be moderate or severe. Night sweats. Decrease in sex drive. Mood swings. Headaches. Tiredness (fatigue). Irritability. Memory problems. Problems falling asleep or staying asleep. Talk with your health care provider about treatment options for your symptoms. Do I need hormone replacement therapy? Hormone replacement therapy is effective in treating symptoms that are caused by menopause, such as hot flashes and night sweats. Hormone replacement carries certain risks, especially as you become older. If you are thinking about using estrogen or estrogen with progestin, discuss the benefits and risks with your health care provider. How can I reduce my risk for heart disease and stroke? The risk of heart disease, heart attack, and stroke increases as you age. One of the causes may be a change in the body's hormones during menopause. This can affect how your body uses dietary fats, triglycerides, and cholesterol. Heart attack and stroke are medical emergencies. There are many things that you can do to help prevent heart disease and stroke. Watch your blood pressure High  blood pressure causes heart disease and increases the risk of stroke. This is more likely to develop in people who have high blood pressure readings or are overweight. Have your blood pressure checked: Every 3-5 years if you are 18-39 years of age. Every year if you are 40 years old or older. Eat a healthy diet  Eat a diet that includes plenty of vegetables, fruits, low-fat dairy products, and lean protein. Do not eat a lot of foods that are high in solid fats, added sugars, or sodium. Get regular exercise Get regular exercise. This is one of the most important things you can do for your health. Most adults should: Try to exercise for at least 150 minutes each week. The exercise should increase your heart rate and make you sweat (moderate-intensity exercise). Try to do strengthening exercises at least twice each week. Do these in addition to the moderate-intensity exercise. Spend less time sitting. Even light physical activity can be beneficial. Other tips Work with your health care provider to achieve or maintain a healthy weight. Do not use any products that contain nicotine or tobacco. These products include cigarettes, chewing tobacco, and vaping devices, such as e-cigarettes. If you need help quitting, ask your health care provider. Know your numbers. Ask your health care provider to check your cholesterol and your blood sugar (glucose). Continue to have your blood tested as directed by your health care provider. Do I need screening for cancer? Depending on your health history and family history, you may need to have cancer screenings at different stages of your life. This may include screening for: Breast cancer. Cervical cancer. Lung cancer. Colorectal cancer. What is my risk for osteoporosis? After menopause, you may be   at increased risk for osteoporosis. Osteoporosis is a condition in which bone destruction happens more quickly than new bone creation. To help prevent osteoporosis or  the bone fractures that can happen because of osteoporosis, you may take the following actions: If you are 19-50 years old, get at least 1,000 mg of calcium and at least 600 international units (IU) of vitamin D per day. If you are older than age 50 but younger than age 70, get at least 1,200 mg of calcium and at least 600 international units (IU) of vitamin D per day. If you are older than age 70, get at least 1,200 mg of calcium and at least 800 international units (IU) of vitamin D per day. Smoking and drinking excessive alcohol increase the risk of osteoporosis. Eat foods that are rich in calcium and vitamin D, and do weight-bearing exercises several times each week as directed by your health care provider. How does menopause affect my mental health? Depression may occur at any age, but it is more common as you become older. Common symptoms of depression include: Feeling depressed. Changes in sleep patterns. Changes in appetite or eating patterns. Feeling an overall lack of motivation or enjoyment of activities that you previously enjoyed. Frequent crying spells. Talk with your health care provider if you think that you are experiencing any of these symptoms. General instructions See your health care provider for regular wellness exams and vaccines. This may include: Scheduling regular health, dental, and eye exams. Getting and maintaining your vaccines. These include: Influenza vaccine. Get this vaccine each year before the flu season begins. Pneumonia vaccine. Shingles vaccine. Tetanus, diphtheria, and pertussis (Tdap) booster vaccine. Your health care provider may also recommend other immunizations. Tell your health care provider if you have ever been abused or do not feel safe at home. Summary Menopause is a normal process in which your ability to get pregnant comes to an end. This condition causes hot flashes, night sweats, decreased interest in sex, mood swings, headaches, or lack  of sleep. Treatment for this condition may include hormone replacement therapy. Take actions to keep yourself healthy, including exercising regularly, eating a healthy diet, watching your weight, and checking your blood pressure and blood sugar levels. Get screened for cancer and depression. Make sure that you are up to date with all your vaccines. This information is not intended to replace advice given to you by your health care provider. Make sure you discuss any questions you have with your health care provider. Document Revised: 06/10/2020 Document Reviewed: 06/10/2020 Elsevier Patient Education  2024 [employer] Inc.  

## 2023-02-23 NOTE — Assessment & Plan Note (Signed)
Encourage diet and exercise for weight loss 

## 2023-02-23 NOTE — Progress Notes (Signed)
Subjective:    Patient ID: Rhonda Mclean, female    DOB: [birth year], 68 y.o.   MRN: [id number]  HPI  Patient presents to clinic today for her annual exam.  Flu: Never Tetanus: >10 years ago COVID: Never Pneumovax: Never Prevnar: Never Shingrix: Never Pap smear: No longer screening Mammogram: 10/2021 Bone density: never Colon screening: 03/2013 Vision screening: annually Dentist: biannually  Diet: She does eat meat. She consumes fruits and veggies. She does eat fried foods. She drinks mostly soda, water, coffee, tea and milk Exercise: None   Review of Systems   Past Medical History:  Diagnosis Date   Anemia    Arthritis    Hyperlipidemia    Hypertension     Current Outpatient Medications  Medication Sig Dispense Refill   ARIPiprazole (ABILIFY IM) Inject into the muscle.     hydrOXYzine (VISTARIL) 25 MG capsule Take 25 mg by mouth daily as needed.     lisinopril (ZESTRIL) 20 MG tablet Take 1 tablet (20 mg total) by mouth daily. 90 tablet 1   OLANZapine (ZYPREXA) 15 MG tablet Take 15 mg by mouth at bedtime.     No current facility-administered medications for this visit.    Allergies  Allergen Reactions   Flavoring Agent (Non-Screening) Other (See Comments)    Blackberry: pt. States she gets fever   Cheese Itching    Cream cheese   Penicillins     No family history on file.  Social History   Socioeconomic History   Marital status: Married    Spouse name: Not on file   Number of children: Not on file   Years of education: Not on file   Highest education level: Not on file  Occupational History   Not on file  Tobacco Use   Smoking status: Never   Smokeless tobacco: Never  Vaping Use   Vaping status: Never Used  Substance and Sexual Activity   Alcohol use: No   Drug use: No   Sexual activity: Not Currently  Other Topics Concern   Not on file  Social History Narrative   Not on file   Social Drivers of Health   Financial Resource Strain:  Not on file  Food Insecurity: Not on file  Transportation Needs: Not on file  Physical Activity: Not on file  Stress: Not on file  Social Connections: Not on file  Intimate Partner Violence: Not on file     Constitutional: Denies fever, malaise, fatigue, headache or abrupt weight changes.  HEENT: Denies eye pain, eye redness, ear pain, ringing in the ears, wax buildup, runny nose, nasal congestion, bloody nose, or sore throat. Respiratory: Denies difficulty breathing, shortness of breath, cough or sputum production.   Cardiovascular: Denies chest pain, chest tightness, palpitations or swelling in the hands or feet.  Gastrointestinal: Denies abdominal pain, bloating, constipation, diarrhea or blood in the stool.  GU: Denies urgency, frequency, pain with urination, burning sensation, blood in urine, odor or discharge. Musculoskeletal: Denies decrease in range of motion, difficulty with gait, muscle pain or joint pain and swelling.  Skin: Denies redness, rashes, lesions or ulcercations.  Neurological: Denies dizziness, difficulty with memory, difficulty with speech or problems with balance and coordination.  Psych: Patient reports audio hallucinations.  Denies anxiety, depression, SI/HI.  No other specific complaints in a complete review of systems (except as listed in HPI above).      Objective:   Physical Exam  BP 130/78 (BP Location: Right Arm, Patient Position: Sitting, Cuff Size:  Normal)   Ht 5' 2.5" (1.588 m)   Wt 178 lb 12.8 oz (81.1 kg)   BMI 32.18 kg/m   Wt Readings from Last 3 Encounters:  08/25/22 184 lb (83.5 kg)  07/04/20 205 lb 14.6 oz (93.4 kg)  03/08/13 206 lb (93.4 kg)    General: Appears her stated age, obese, in NAD. Skin: Warm, dry and intact. HEENT: Head: normal shape and size; Eyes: sclera white, no icterus, conjunctiva pink, PERRLA and EOMs intact;  Neck:  Neck supple, trachea midline. No masses, lumps or thyromegaly present.  Cardiovascular: Normal  rate and rhythm. S1,S2 noted.  Murmur noted. No JVD or BLE edema. No carotid bruits noted. Pulmonary/Chest: Normal effort and positive vesicular breath sounds. No respiratory distress. No wheezes, rales or ronchi noted.  Abdomen: Soft and nontender. Normal bowel sounds.  Musculoskeletal: Strength 5/5 BUE/BLE.  No difficulty with gait.  Neurological: Alert and oriented. Cranial nerves II-XII grossly intact. Coordination normal.  Psychiatric: Mood and affect flat. Behavior is normal. Judgment and thought content normal.    BMET    Component Value Date/Time   NA 139 08/25/2022 1036   K 4.3 08/25/2022 1036   CL 106 08/25/2022 1036   CO2 27 08/25/2022 1036   GLUCOSE 88 08/25/2022 1036   BUN 12 08/25/2022 1036   CREATININE 1.19 (H) 08/25/2022 1036   CALCIUM 9.6 08/25/2022 1036    Lipid Panel     Component Value Date/Time   CHOL 156 08/25/2022 1036   TRIG 64 08/25/2022 1036   HDL 56 08/25/2022 1036   CHOLHDL 2.8 08/25/2022 1036   LDLCALC 85 08/25/2022 1036    CBC    Component Value Date/Time   WBC 4.9 08/25/2022 1036   RBC 3.86 08/25/2022 1036   HGB 11.5 (L) 08/25/2022 1036   HCT 35.5 08/25/2022 1036   PLT 170 08/25/2022 1036   MCV 92.0 08/25/2022 1036   MCH 29.8 08/25/2022 1036   MCHC 32.4 08/25/2022 1036   RDW 13.9 08/25/2022 1036   LYMPHSABS 1,201 08/25/2022 1036   EOSABS 0 (L) 08/25/2022 1036   BASOSABS 10 08/25/2022 1036    Hgb A1C Lab Results  Component Value Date   HGBA1C 4.8 08/25/2022            Assessment & Plan:   Preventative health maintenance:  She declines flu shot She declines tetanus for financial reasons, advised her if she gets bit or cut to go get this done She declines COVID-vaccine She declines Pneumovax or Prevnar She declines Shingrix She no longer wants to screen for cervical cancer She declines mammogram She declines bone density She declines referral to GI for screening colonoscopy or Cologuard at this time Encouraged her to  consume a balanced diet and exercise regimen Advised her to see an eye doctor and dentist annually We will check CBC, c-Met, lipid profile and A1c Barbar Brede today  RTC in 6 months for follow-up of chronic conditions Nicki Reaper, NP

## 2023-02-24 LAB — COMPLETE METABOLIC PANEL WITH GFR
AG Ratio: 1.4 (calc) (ref 1.0–2.5)
ALT: 10 U/L (ref 6–29)
AST: 19 U/L (ref 10–35)
Albumin: 4.2 g/dL (ref 3.6–5.1)
Alkaline phosphatase (APISO): 87 U/L (ref 37–153)
BUN/Creatinine Ratio: 10 (calc) (ref 6–22)
BUN: 12 mg/dL (ref 7–25)
CO2: 26 mmol/L (ref 20–32)
Calcium: 9.1 mg/dL (ref 8.6–10.4)
Chloride: 103 mmol/L (ref 98–110)
Creat: 1.25 mg/dL — ABNORMAL HIGH (ref 0.50–1.05)
Globulin: 2.9 g/dL (ref 1.9–3.7)
Glucose, Bld: 121 mg/dL (ref 65–139)
Potassium: 4.2 mmol/L (ref 3.5–5.3)
Sodium: 136 mmol/L (ref 135–146)
Total Bilirubin: 0.5 mg/dL (ref 0.2–1.2)
Total Protein: 7.1 g/dL (ref 6.1–8.1)
eGFR: 47 mL/min/{1.73_m2} — ABNORMAL LOW (ref >=60)

## 2023-02-24 LAB — CBC
HCT: 36.1 % (ref 35.0–45.0)
Hemoglobin: 12.2 g/dL (ref 11.7–15.5)
MCH: 29.6 pg (ref 27.0–33.0)
MCHC: 33.8 g/dL (ref 32.0–36.0)
MCV: 87.6 fL (ref 80.0–100.0)
MPV: 9.9 fL (ref 7.5–12.5)
Platelets: 195 10*3/uL (ref 140–400)
RBC: 4.12 10*6/uL (ref 3.80–5.10)
RDW: 13.3 % (ref 11.0–15.0)
WBC: 5.7 10*3/uL (ref 3.8–10.8)

## 2023-02-24 LAB — LIPID PANEL
Cholesterol: 155 mg/dL (ref <200)
HDL: 52 mg/dL (ref >=50)
LDL Cholesterol (Calc): 87 mg/dL
Non-HDL Cholesterol (Calc): 103 mg/dL (ref <130)
Total CHOL/HDL Ratio: 3 (calc) (ref <5.0)
Triglycerides: 69 mg/dL (ref <150)

## 2023-02-24 LAB — HEMOGLOBIN A1C
Hgb A1c MFr Bld: 4.8 %{Hb} (ref <5.7)
Mean Plasma Glucose: 91 mg/dL
eAG (mmol/L): 5 mmol/L

## 2023-03-17 DIAGNOSIS — H25813 Combined forms of age-related cataract, bilateral: Secondary | ICD-10-CM | POA: Diagnosis not present

## 2023-03-19 DIAGNOSIS — F1511 Other stimulant abuse, in remission: Secondary | ICD-10-CM | POA: Diagnosis not present

## 2023-03-29 DIAGNOSIS — F1511 Other stimulant abuse, in remission: Secondary | ICD-10-CM | POA: Diagnosis not present

## 2023-04-28 DIAGNOSIS — F1511 Other stimulant abuse, in remission: Secondary | ICD-10-CM | POA: Diagnosis not present

## 2023-05-04 DIAGNOSIS — F1511 Other stimulant abuse, in remission: Secondary | ICD-10-CM | POA: Diagnosis not present

## 2023-08-23 ENCOUNTER — Other Ambulatory Visit: Payer: Self-pay | Admitting: Internal Medicine

## 2023-08-25 ENCOUNTER — Ambulatory Visit: Payer: Self-pay | Admitting: Internal Medicine

## 2023-08-25 NOTE — Telephone Encounter (Signed)
 Requested Prescriptions  Pending Prescriptions Disp Refills   lisinopril  (ZESTRIL ) 20 MG tablet [Pharmacy Med Name: LISINOPRIL  20 MG TAB] 90 tablet 0    Sig: TAKE 1 TABLET BY MOUTH DAILY     Cardiovascular:  ACE Inhibitors Failed - 08/25/2023  3:47 PM      Failed - Cr in normal range and within 180 days    Creat  Date Value Ref Range Status  02/23/2023 1.25 (H) 0.50 - 1.05 mg/dL Final         Failed - K in normal range and within 180 days    Potassium  Date Value Ref Range Status  02/23/2023 4.2 3.5 - 5.3 mmol/L Final         Failed - Valid encounter within last 6 months    Recent Outpatient Visits   None            Passed - Patient is not pregnant      Passed - Last BP in normal range    BP Readings from Last 1 Encounters:  02/23/23 130/78

## 2023-10-08 ENCOUNTER — Telehealth: Payer: Self-pay

## 2023-10-08 NOTE — Telephone Encounter (Signed)
 Copied from CRM [id number]. Topic: Clinical - Request for Lab/Test Order >> Oct 08, 2023 10:01 AM Dedra B wrote: Reason for CRM: Pt is requesting to have labs done. She did not specify what labs are needed. No orders are in. Pls call pt when orders are in.

## 2023-10-13 ENCOUNTER — Ambulatory Visit: Admitting: Internal Medicine

## 2023-10-13 NOTE — Progress Notes (Deleted)
 Subjective:  Patient: Rhonda Mclean DOB: 1955-08-31 MRN: [id number]   HPI  Patient presents to clinic today for follow-up of chronic conditions  HTN: Her BP today is 132/84. She is taking lisinopril   as prescribed.  ECG from 05/2005 reviewed.  Schizophrenia: Managed on olanzapine, aripiprazole and hydroxyzine. She does have audio but not visual hallucinations. She is not current seeing a therapist but is seeing a psychiatrist. She denies anxiety, depression, SI/HI.  Past Medical History:  Diagnosis Date   Anemia    Arthritis    Hyperlipidemia    Hypertension     Current Outpatient Medications  Medication Sig Dispense Refill   ARIPiprazole (ABILIFY IM) Inject into the muscle.     hydrOXYzine (VISTARIL) 25 MG capsule Take 25 mg by mouth daily as needed.     lisinopril  (ZESTRIL ) 20 MG tablet TAKE 1 TABLET BY MOUTH DAILY 90 tablet 0   OLANZapine (ZYPREXA) 15 MG tablet Take 15 mg by mouth at bedtime.     No current facility-administered medications for this visit.    Allergies  Allergen Reactions   Flavoring Agent (Non-Screening) Other (See Comments)    Blackberry: pt. States she gets fever   Cheese Itching    Cream cheese   Penicillins     No family history on file.  Social History   Socioeconomic History   Marital status: Married    Spouse name: Not on file   Number of children: Not on file   Years of education: Not on file   Highest education level: Not on file  Occupational History   Not on file  Tobacco Use   Smoking status: Never   Smokeless tobacco: Never  Vaping Use   Vaping status: Never Used  Substance and Sexual Activity   Alcohol use: No   Drug use: No   Sexual activity: Not Currently  Other Topics Concern   Not on file  Social History Narrative   Not on file   Social Drivers of Health   Financial Resource Strain: Not on file  Food Insecurity: Not on file  Transportation Needs: Not on file  Physical Activity: Not on file  Stress: Not  on file  Social Connections: Not on file  Intimate Partner Violence: Not on file    ROS:  Constitutional: Denies fever, malaise, fatigue, headache or abrupt weight changes.  HEENT: Denies eye pain, eye redness, ear pain, ringing in the ears, wax buildup, runny nose, nasal congestion, bloody nose, or sore throat. Respiratory: Denies difficulty breathing, shortness of breath, cough or sputum production.   Cardiovascular: Denies chest pain, chest tightness, palpitations or swelling in the hands or feet.  Gastrointestinal: Denies abdominal pain, bloating, constipation, diarrhea or blood in the stool.  GU: Denies frequency, urgency, pain with urination, blood in urine, odor or discharge. Musculoskeletal: Denies decrease in range of motion, difficulty with gait, muscle pain or joint pain and swelling.  Skin: Denies redness, rashes, lesions or ulcercations.  Neurological: Denies dizziness, difficulty with memory, difficulty with speech or problems with balance and coordination.  Psych: Patient reports audio hallucinations.  Denies anxiety, depression, SI/HI.  No other specific complaints in a complete review of systems (except as listed in HPI above).  PE:  There were no vitals taken for this visit. Wt Readings from Last 3 Encounters:  02/23/23 178 lb 12.8 oz (81.1 kg)  08/25/22 184 lb (83.5 kg)  07/04/20 205 lb 14.6 oz (93.4 kg)    General: Appears her stated age, obese, in  NAD. HEENT: Head: normal shape and size; Eyes: sclera white, no icterus, conjunctiva pink, PERRLA and EOMs intact;  Cardiovascular: Normal rate and rhythm. S1,S2 noted.  No murmur, rubs or gallops noted. No JVD or BLE edema. No carotid bruits noted. Pulmonary/Chest: Normal effort and positive vesicular breath sounds. No respiratory distress. No wheezes, rales or ronchi noted.  Musculoskeletal: No difficulty with gait.  Neurological: Alert and oriented.  Psychiatric: Mood and affect flat. Behavior is normal. Judgment  and thought content normal.   CBC    Component Value Date/Time   WBC 5.7 02/23/2023 1013   RBC 4.12 02/23/2023 1013   HGB 12.2 02/23/2023 1013   HCT 36.1 02/23/2023 1013   PLT 195 02/23/2023 1013   MCV 87.6 02/23/2023 1013   MCH 29.6 02/23/2023 1013   MCHC 33.8 02/23/2023 1013   RDW 13.3 02/23/2023 1013   LYMPHSABS 1,201 08/25/2022 1036   EOSABS 0 (L) 08/25/2022 1036   BASOSABS 10 08/25/2022 1036      Latest Ref Rng & Units 02/23/2023   10:13 AM 08/25/2022   10:36 AM  CMP  Glucose 65 - 139 mg/dL 878  88   BUN 7 - 25 mg/dL 12  12   Creatinine 9.49 - 1.05 mg/dL 8.74  8.80   Sodium 864 - 146 mmol/L 136  139   Potassium 3.5 - 5.3 mmol/L 4.2  4.3   Chloride 98 - 110 mmol/L 103  106   CO2 20 - 32 mmol/L 26  27   Calcium 8.6 - 10.4 mg/dL 9.1  9.6   Total Protein 6.1 - 8.1 g/dL 7.1  6.9   Total Bilirubin 0.2 - 1.2 mg/dL 0.5  0.7   AST 10 - 35 U/L 19  17   ALT 6 - 29 U/L 10  11    Lipid Panel     Component Value Date/Time   CHOL 155 02/23/2023 1013   TRIG 69 02/23/2023 1013   HDL 52 02/23/2023 1013   CHOLHDL 3.0 02/23/2023 1013   LDLCALC 87 02/23/2023 1013   Lab Results  Component Value Date   HGBA1C 4.8 02/23/2023   HGBA1C 4.8 08/25/2022     Assessment and Plan:    RTC in 6 months for your annual exam Angeline Laura, NP

## 2023-11-10 ENCOUNTER — Ambulatory Visit: Admitting: Internal Medicine

## 2023-11-10 NOTE — Progress Notes (Deleted)
 Subjective:    Subjective Patient ID: Rhonda Mclean, female    DOB: 03/05/1955, 68 y.o.   MRN: [id number]   HPI  Patient presents to clinic today to establish care and for management of the conditions listed below.  HTN: Her BP today is 132/84. She is taking lisinopril   as prescribed.  ECG from 09/2022 reviewed.  Schizophrenia: Managed on olanzapine, aripiprazole and hydroxyzine. She does have audio but not visual hallucinations. She is not current seeing a therapist but is seeing a psychiatrist. She denies anxiety, depression, SI/HI.  Past Medical History:  Diagnosis Date   Anemia    Arthritis    Hyperlipidemia    Hypertension     Current Outpatient Medications  Medication Sig Dispense Refill   ARIPiprazole (ABILIFY IM) Inject into the muscle.     hydrOXYzine (VISTARIL) 25 MG capsule Take 25 mg by mouth daily as needed.     lisinopril  (ZESTRIL ) 20 MG tablet TAKE 1 TABLET BY MOUTH DAILY 90 tablet 0   OLANZapine (ZYPREXA) 15 MG tablet Take 15 mg by mouth at bedtime.     No current facility-administered medications for this visit.    Allergies  Allergen Reactions   Flavoring Agent (Non-Screening) Other (See Comments)    Blackberry: pt. States she gets fever   Cheese Itching    Cream cheese   Penicillins     No family history on file.  Social History   Socioeconomic History   Marital status: Married    Spouse name: Not on file   Number of children: Not on file   Years of education: Not on file   Highest education level: Not on file  Occupational History   Not on file  Tobacco Use   Smoking status: Never   Smokeless tobacco: Never  Vaping Use   Vaping status: Never Used  Substance and Sexual Activity   Alcohol use: No   Drug use: No   Sexual activity: Not Currently  Other Topics Concern   Not on file  Social History Narrative   Not on file   Social Drivers of Health   Financial Resource Strain: Not on file  Food Insecurity: Not on file   Transportation Needs: Not on file  Physical Activity: Not on file  Stress: Not on file  Social Connections: Not on file  Intimate Partner Violence: Not on file    ROS:  Constitutional: Denies fever, malaise, fatigue, headache or abrupt weight changes.  HEENT: Denies eye pain, eye redness, ear pain, ringing in the ears, wax buildup, runny nose, nasal congestion, bloody nose, or sore throat. Respiratory: Denies difficulty breathing, shortness of breath, cough or sputum production.   Cardiovascular: Denies chest pain, chest tightness, palpitations or swelling in the hands or feet.  Gastrointestinal: Denies abdominal pain, bloating, constipation, diarrhea or blood in the stool.  GU: Denies frequency, urgency, pain with urination, blood in urine, odor or discharge. Musculoskeletal: Denies decrease in range of motion, difficulty with gait, muscle pain or joint pain and swelling.  Skin: Denies redness, rashes, lesions or ulcercations.  Neurological: Denies dizziness, difficulty with memory, difficulty with speech or problems with balance and coordination.  Psych: Patient reports audio hallucinations.  Denies anxiety, depression, SI/HI.  No other specific complaints in a complete review of systems (except as listed in HPI above).  PE:  There were no vitals taken for this visit. Wt Readings from Last 3 Encounters:  02/23/23 178 lb 12.8 oz (81.1 kg)  08/25/22 184 lb (  83.5 kg)  07/04/20 205 lb 14.6 oz (93.4 kg)    General: Appears her stated age, obese, in NAD. HEENT: Head: normal shape and size; Eyes: sclera white, no icterus, conjunctiva pink, PERRLA and EOMs intact;  Cardiovascular: Normal rate and rhythm. S1,S2 noted.  No murmur, rubs or gallops noted. No JVD or BLE edema. No carotid bruits noted. Pulmonary/Chest: Normal effort and positive vesicular breath sounds. No respiratory distress. No wheezes, rales or ronchi noted.  Musculoskeletal: No difficulty with gait.  Neurological:  Alert and oriented.  Psychiatric: Mood and affect flat. Behavior is normal. Judgment and thought content normal.   CBC    Component Value Date/Time   WBC 5.7 02/23/2023 1013   RBC 4.12 02/23/2023 1013   HGB 12.2 02/23/2023 1013   HCT 36.1 02/23/2023 1013   PLT 195 02/23/2023 1013   MCV 87.6 02/23/2023 1013   MCH 29.6 02/23/2023 1013   MCHC 33.8 02/23/2023 1013   RDW 13.3 02/23/2023 1013   LYMPHSABS 1,201 08/25/2022 1036   EOSABS 0 (L) 08/25/2022 1036   BASOSABS 10 08/25/2022 1036      Latest Ref Rng & Units 02/23/2023   10:13 AM 08/25/2022   10:36 AM  CMP  Glucose 65 - 139 mg/dL 878  88   BUN 7 - 25 mg/dL 12  12   Creatinine 9.49 - 1.05 mg/dL 8.74  8.80   Sodium 864 - 146 mmol/L 136  139   Potassium 3.5 - 5.3 mmol/L 4.2  4.3   Chloride 98 - 110 mmol/L 103  106   CO2 20 - 32 mmol/L 26  27   Calcium 8.6 - 10.4 mg/dL 9.1  9.6   Total Protein 6.1 - 8.1 g/dL 7.1  6.9   Total Bilirubin 0.2 - 1.2 mg/dL 0.5  0.7   AST 10 - 35 U/L 19  17   ALT 6 - 29 U/L 10  11    Lipid Panel     Component Value Date/Time   CHOL 155 02/23/2023 1013   TRIG 69 02/23/2023 1013   HDL 52 02/23/2023 1013   CHOLHDL 3.0 02/23/2023 1013   LDLCALC 87 02/23/2023 1013   Lab Results  Component Value Date   HGBA1C 4.8 02/23/2023   HGBA1C 4.8 08/25/2022      Assessment and Plan:    RTC in 3  months for your annual exam Angeline Laura, NP

## 2023-11-15 ENCOUNTER — Encounter: Payer: Self-pay | Admitting: Internal Medicine

## 2023-11-16 ENCOUNTER — Other Ambulatory Visit: Payer: Self-pay | Admitting: Internal Medicine

## 2023-11-18 NOTE — Telephone Encounter (Signed)
 Requested Prescriptions  Pending Prescriptions Disp Refills   lisinopril  (ZESTRIL ) 20 MG tablet [Pharmacy Med Name: LISINOPRIL  20 MG TAB] 90 tablet 0    Sig: TAKE 1 TABLET BY MOUTH DAILY     Cardiovascular:  ACE Inhibitors Failed - 11/18/2023  2:32 PM      Failed - Cr in normal range and within 180 days    Creat  Date Value Ref Range Status  02/23/2023 1.25 (H) 0.50 - 1.05 mg/dL Final         Failed - K in normal range and within 180 days    Potassium  Date Value Ref Range Status  02/23/2023 4.2 3.5 - 5.3 mmol/L Final         Failed - Valid encounter within last 6 months    Recent Outpatient Visits   None            Passed - Patient is not pregnant      Passed - Last BP in normal range    BP Readings from Last 1 Encounters:  02/23/23 130/78

## 2024-02-08 ENCOUNTER — Other Ambulatory Visit: Payer: Self-pay | Admitting: Internal Medicine

## 2024-02-10 NOTE — Telephone Encounter (Signed)
 Courtesy refill given, appointment needed.   Requested Prescriptions  Pending Prescriptions Disp Refills   lisinopril  (ZESTRIL ) 20 MG tablet [Pharmacy Med Name: LISINOPRIL  20 MG TAB] 90 tablet 0    Sig: TAKE 1 TABLET BY MOUTH DAILY     Cardiovascular:  ACE Inhibitors Failed - 02/10/2024  9:43 AM      Failed - Cr in normal range and within 180 days    Creat  Date Value Ref Range Status  02/23/2023 1.25 (H) 0.50 - 1.05 mg/dL Final         Failed - K in normal range and within 180 days    Potassium  Date Value Ref Range Status  02/23/2023 4.2 3.5 - 5.3 mmol/L Final         Failed - Valid encounter within last 6 months    Recent Outpatient Visits   None            Passed - Patient is not pregnant      Passed - Last BP in normal range    BP Readings from Last 1 Encounters:  02/23/23 130/78

## 2024-02-17 ENCOUNTER — Ambulatory Visit: Payer: Self-pay

## 2024-02-17 NOTE — Telephone Encounter (Signed)
 FYI Only or Action Required?: FYI only for provider: appointment scheduled on 02/22/24.  Patient was last seen in primary care on 02/23/2023 by Antonette Angeline ORN, NP.  Called Nurse Triage reporting Hip Pain.  Symptoms began 1-2 weeks.  Interventions attempted: OTC medications: Advil.  Symptoms are: gradually worsening.  Triage Disposition: See Within 2 Weeks in Office (overriding See PCP When Office is Open (Within 3 Days))  Patient/caregiver understands and will follow disposition?: Yes               Copied from CRM [id number]. Topic: Clinical - Red Word Triage >> Feb 17, 2024  2:24 PM Mia F wrote: Red Word that prompted transfer to Nurse Triage: Pain in the left hip. Started last week. Shes ays the oain is not getting worse but is consistant but worse when standing. Reason for Disposition  [1] MODERATE pain (e.g., interferes with normal activities, limping) AND [2] present > 3 days  Answer Assessment - Initial Assessment Questions Offered acute visit with PCP Friday or Monday. Patient declined and states the soonest she can come in is Tuesday. Scheduled and advised to call back for worsening symptoms.    1. LOCATION and RADIATION: Where is the pain located? Does the pain spread (shoot) anywhere else?     Left hip  2. QUALITY: What does the pain feel like?  (e.g., sharp, dull, aching, burning)     Like someone stuck a needle in me  3. SEVERITY: How bad is the pain? What does it keep you from doing?   (Scale 1-10; or mild, moderate, severe)     6/10.  4. ONSET: When did the pain start? Does it come and go, or is it there all the time?     1-2 weeks ago, comes and goes.  5. WORK OR EXERCISE: Has there been any recent work or exercise that involved this part of the body?      No.  6. CAUSE: What do you think is causing the hip pain?      Unsure.  7. AGGRAVATING FACTORS: What makes the hip pain worse? (e.g., walking, climbing stairs, running)      Standing more than 10 minutes.  8. OTHER SYMPTOMS: Do you have any other symptoms? (e.g., back pain, pain shooting down leg,  fever, rash)     No falls or injury, changes to bowel or bladder control, fever, bruising, discoloration  Protocols used: Hip Pain-A-AH

## 2024-02-17 NOTE — Telephone Encounter (Signed)
 noted

## 2024-02-22 ENCOUNTER — Ambulatory Visit: Admitting: Internal Medicine

## 2024-03-07 ENCOUNTER — Telehealth: Payer: Self-pay

## 2024-03-07 NOTE — Telephone Encounter (Signed)
 Scheduled patient to see Angeline on 2/12. Explained if labs are needed Angeline will do them that day

## 2024-03-07 NOTE — Telephone Encounter (Signed)
 Copied from CRM #[id number]. Topic: Clinical - Request for Lab/Test Order >> Mar 07, 2024  4:20 PM Rhonda Mclean wrote: Reason for CRM: Pt states she was advised by PCP that blood work needed to be drawn but I show no order. Please call pt on # [phone]

## 2024-03-16 ENCOUNTER — Ambulatory Visit: Admitting: Internal Medicine
